# Patient Record
Sex: Female | Born: 1970 | Race: White | Hispanic: No | Marital: Married | Smoking: Never smoker
Health system: Southern US, Community
[De-identification: ages and names within clinical notes are randomized; demographics above are authoritative.]

## PROBLEM LIST (undated history)

## (undated) DIAGNOSIS — O039 Complete or unspecified spontaneous abortion without complication: Secondary | ICD-10-CM

## (undated) DIAGNOSIS — I471 Supraventricular tachycardia, unspecified: Secondary | ICD-10-CM

## (undated) DIAGNOSIS — I493 Ventricular premature depolarization: Secondary | ICD-10-CM

## (undated) DIAGNOSIS — Q250 Patent ductus arteriosus: Secondary | ICD-10-CM

## (undated) HISTORY — DX: Patent ductus arteriosus: Q25.0

## (undated) HISTORY — DX: Ventricular premature depolarization: I49.3

## (undated) HISTORY — DX: Supraventricular tachycardia: I47.1

## (undated) HISTORY — DX: Complete or unspecified spontaneous abortion without complication: O03.9

## (undated) HISTORY — DX: Supraventricular tachycardia, unspecified: I47.10

---
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
## 1999-04-12 ENCOUNTER — Other Ambulatory Visit: Admission: RE | Admit: 1999-04-12 | Discharge: 1999-04-12 | Payer: Self-pay | Admitting: Obstetrics and Gynecology

## 2000-02-19 ENCOUNTER — Other Ambulatory Visit: Admission: RE | Admit: 2000-02-19 | Discharge: 2000-02-19 | Payer: Self-pay | Admitting: Obstetrics and Gynecology

## 2000-04-04 ENCOUNTER — Encounter: Payer: Self-pay | Admitting: Obstetrics and Gynecology

## 2000-04-04 ENCOUNTER — Ambulatory Visit (HOSPITAL_COMMUNITY): Admission: RE | Admit: 2000-04-04 | Discharge: 2000-04-04 | Payer: Self-pay | Admitting: Obstetrics and Gynecology

## 2000-06-17 ENCOUNTER — Ambulatory Visit (HOSPITAL_COMMUNITY): Admission: RE | Admit: 2000-06-17 | Discharge: 2000-06-17 | Payer: Self-pay | Admitting: Obstetrics and Gynecology

## 2000-08-25 ENCOUNTER — Inpatient Hospital Stay (HOSPITAL_COMMUNITY): Admission: AD | Admit: 2000-08-25 | Discharge: 2000-08-27 | Payer: Self-pay | Admitting: Obstetrics and Gynecology

## 2001-03-19 ENCOUNTER — Other Ambulatory Visit: Admission: RE | Admit: 2001-03-19 | Discharge: 2001-03-19 | Payer: Self-pay | Admitting: Obstetrics and Gynecology

## 2002-04-13 ENCOUNTER — Other Ambulatory Visit: Admission: RE | Admit: 2002-04-13 | Discharge: 2002-04-13 | Payer: Self-pay | Admitting: Obstetrics and Gynecology

## 2003-12-06 ENCOUNTER — Other Ambulatory Visit: Admission: RE | Admit: 2003-12-06 | Discharge: 2003-12-06 | Payer: Self-pay | Admitting: Obstetrics and Gynecology

## 2004-11-29 ENCOUNTER — Inpatient Hospital Stay (HOSPITAL_COMMUNITY): Admission: AD | Admit: 2004-11-29 | Discharge: 2004-11-29 | Payer: Self-pay | Admitting: Obstetrics and Gynecology

## 2005-02-12 ENCOUNTER — Inpatient Hospital Stay (HOSPITAL_COMMUNITY): Admission: AD | Admit: 2005-02-12 | Discharge: 2005-02-13 | Payer: Self-pay | Admitting: Obstetrics and Gynecology

## 2005-02-14 ENCOUNTER — Encounter: Admission: RE | Admit: 2005-02-14 | Discharge: 2005-02-26 | Payer: Self-pay | Admitting: Obstetrics and Gynecology

## 2005-03-26 ENCOUNTER — Other Ambulatory Visit: Admission: RE | Admit: 2005-03-26 | Discharge: 2005-03-26 | Payer: Self-pay | Admitting: Obstetrics and Gynecology

## 2005-06-04 ENCOUNTER — Encounter: Admission: RE | Admit: 2005-06-04 | Discharge: 2005-06-04 | Payer: Self-pay | Admitting: Dermatology

## 2007-08-16 IMAGING — US EM OFFICE/OP CONSULT LEVEL 3 (40)
1 series · 13 of 16 positions shown · non-contrast
Comparison: none

[Series 1: unknown · 13 of 42 slices shown]
[im 1/42]
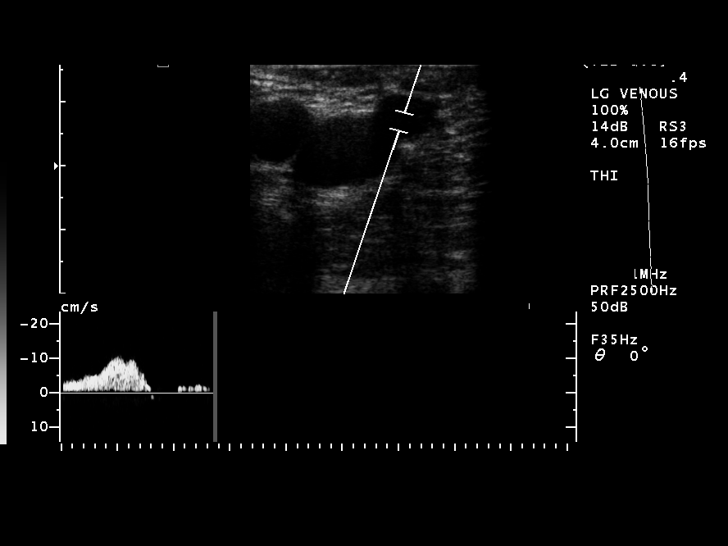
[im 3/42]
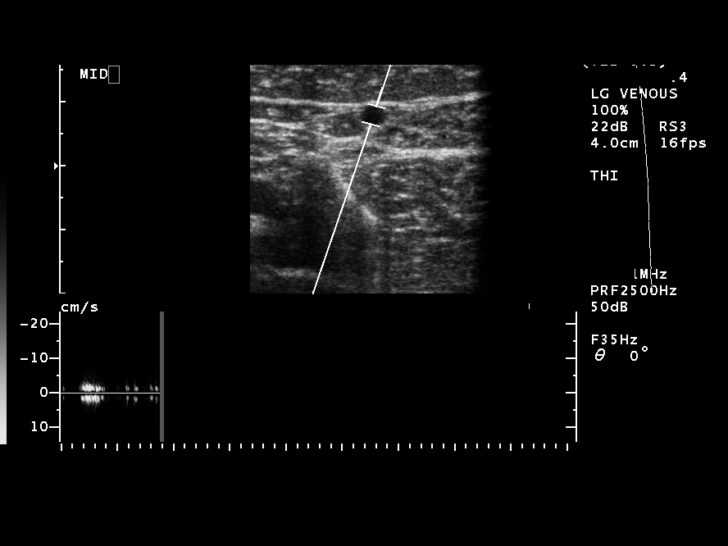
[im 9/42]
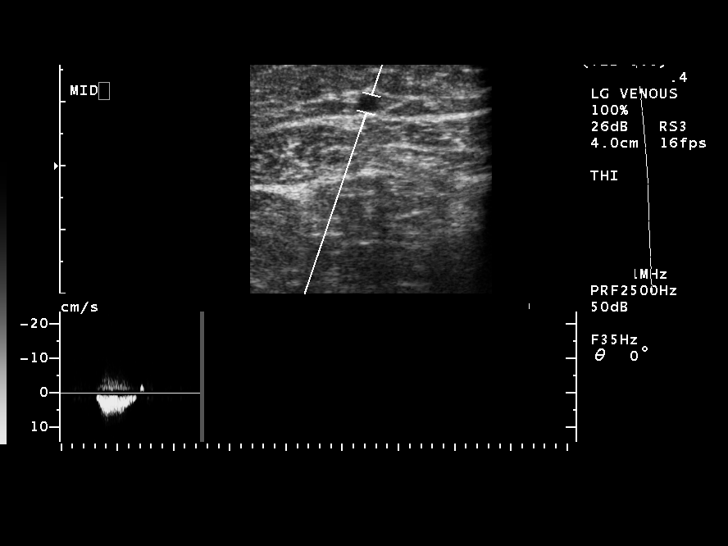
[im 11/42]
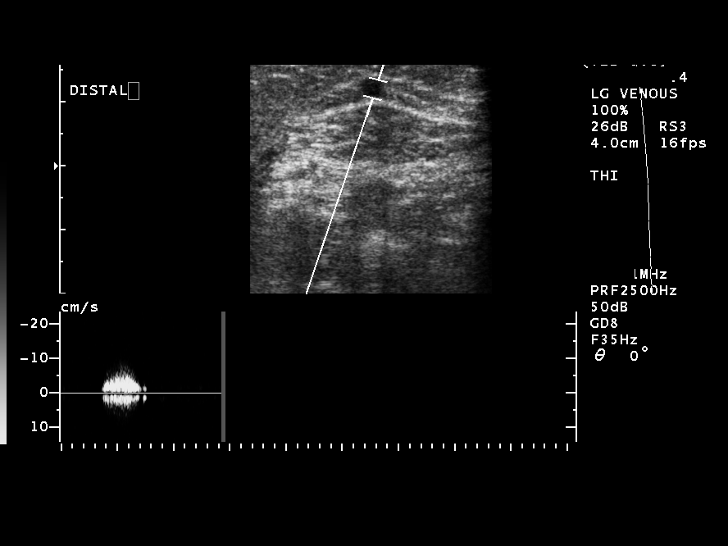
[im 14/42]
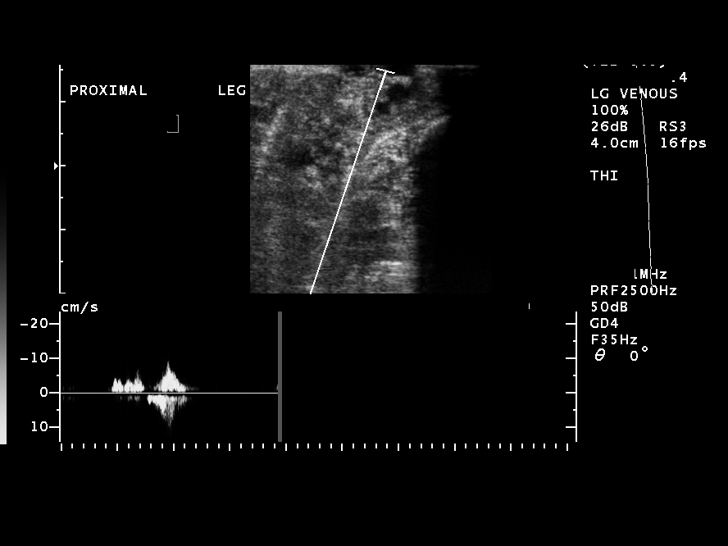
[im 17/42]
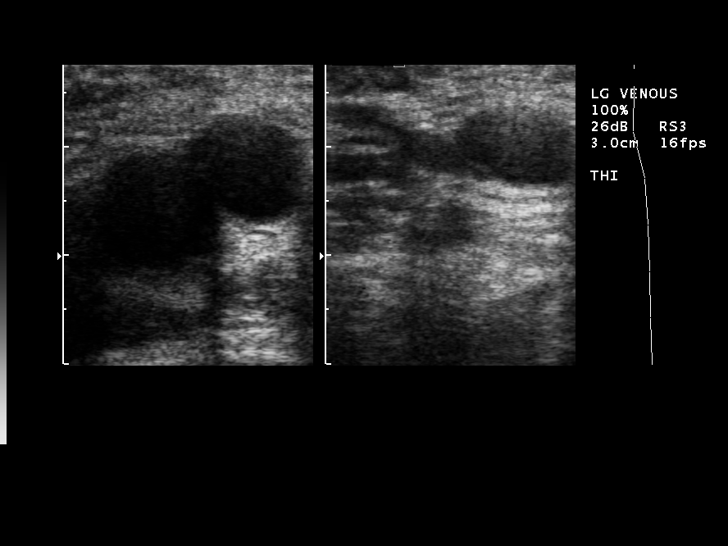
[im 22/42]
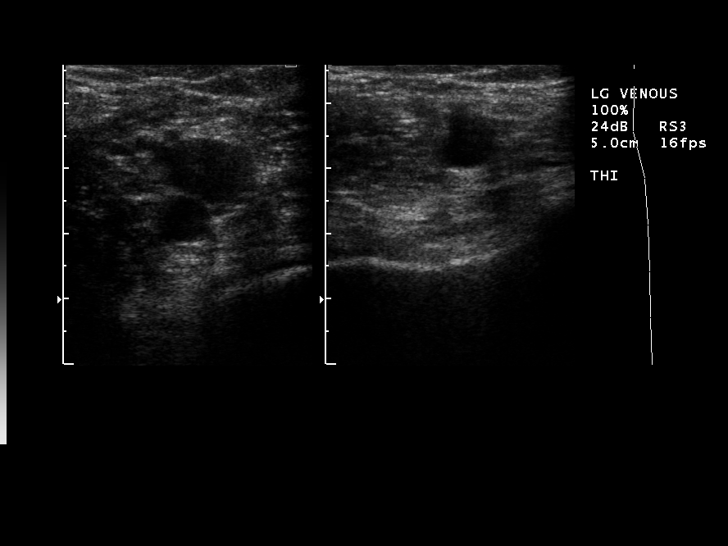
[im 25/42]
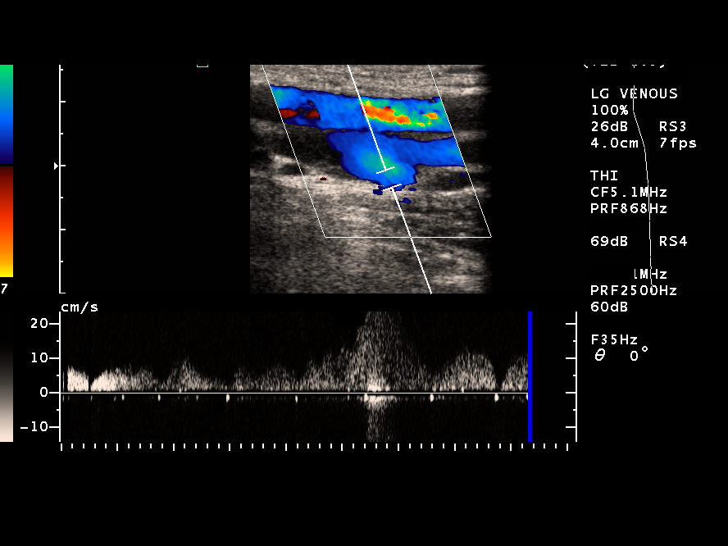
[im 28/42]
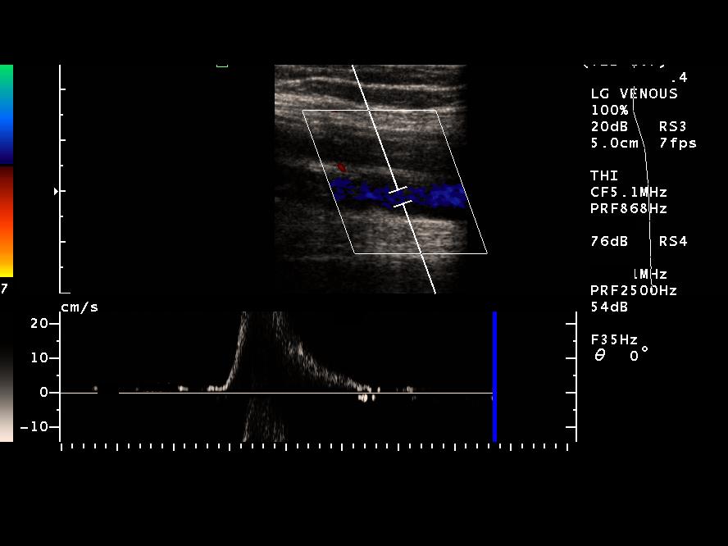
[im 31/42]
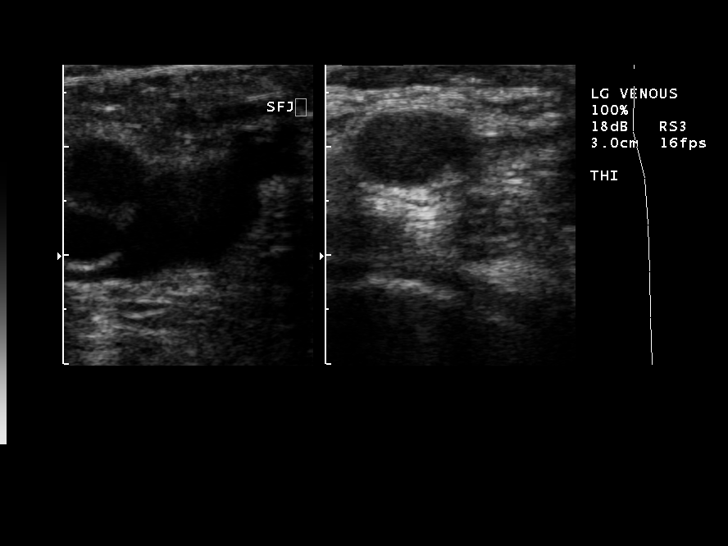
[im 33/42]
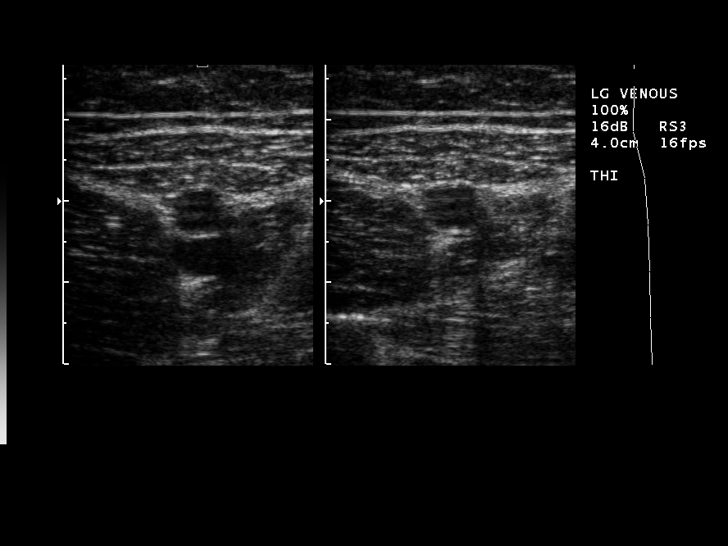
[im 39/42]
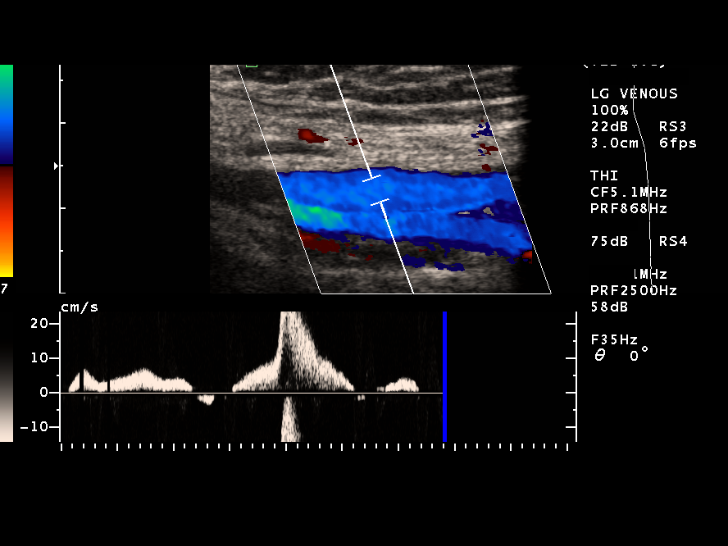
[im 42/42]
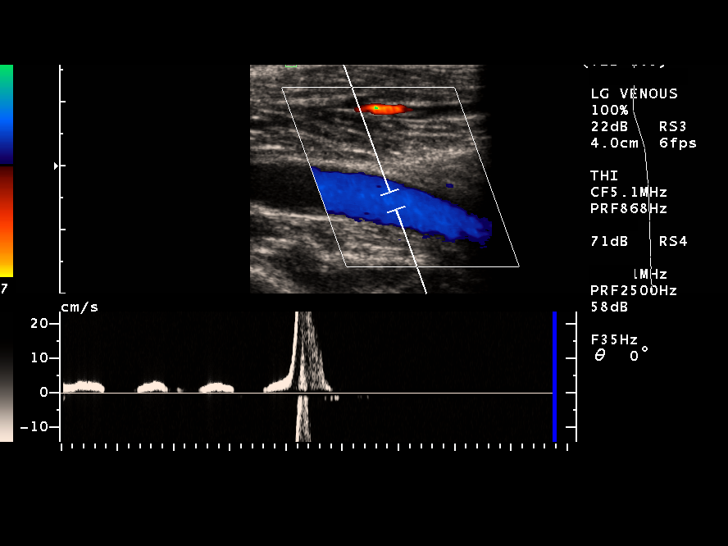

[13 of 16 positions shown; findings below may reference images not displayed]

OUTPATIENT CONSULTATION ? LEVEL III ? [id number]

 Juaquin Prajapati, M.D. 
 [HOSPITAL] Dermatology

 RE:  Dohoon Lauer (DOB ? 06/15/1970)

 Fellner Joas: 

 Thank you for referring Ms. Isacc for lower extremity venous evaluation for developing varicose veins.  

 Review of her venous history was performed.  She has had no prior treatments for varicose or spider veins.  There is no history of DVT or phlebitis.  She does have a family history of spider veins which affected her mother.  She currently does not take any hormone replacement therapy or birth control pills.  She has two small children at home and the small left calf varicose veins, spider veins have worsened since her last pregnancy.  She does wear over-the-counter knee high compression stockings occasionally.  She currently stays very busy with two young children at home however previously worked in hotel management and fitness which required standing 10 to 12 hours a day on concrete floors.  She currently does not take any pain medication for lower extremity pain.  Elevation does not alleviate the heaviness and pulling sensation in her legs.  

 On physical exam, the patient has scattered bilateral thigh, popliteal, and calf spider telangiectasias.  No evidence of lower extremity edema.  Symmetric +2 peripheral pulses.  In the medial left calf there is a small palpable subsurface varicose vein.  Ultrasound was performed today.  This was dictated as a separate report.  Briefly, the patient has normal superficial and deep venous anatomy.  No evidence of GSV or SSV venous insufficiency.  The left calf small developing varicosity is secondary to a posterior tibial incompetent perforator from the deep venous system.  

 Today, I spent approximately 45 minutes with Ms. Isacc and in detail discussed the physical exam and ultrasound findings.  Our discussion centered around treatment of the left calf small developing varicose vein.  The two treatment options would include ultrasound guided foam sclerotherapy and ambulatory phlebectomy.  The procedures, risks, benefits, and alternatives were reviewed.  For spider veins, these would respond to injection sclerotherapy.  This also was reviewed including the benefits, outcomes, risks, and complications.  

 At this time she would like to continue with conservative management and will consider injection sclerotherapy in the cooler months during the fall and winter.  She was given our contact information for any questions or if she would like to schedule injection sclerotherapy.  

 Again, thank you for referring Ms. Isacc for lower extremity venous evaluation.  

 Tobias-Allen, Chasrone, M.D.
 OVOCE:[place]

## 2008-05-19 ENCOUNTER — Encounter: Admission: RE | Admit: 2008-05-19 | Discharge: 2008-05-19 | Payer: Self-pay | Admitting: Obstetrics and Gynecology

## 2010-06-22 NOTE — Discharge Summary (Signed)
[facility]  Patient:    Linda Ho, Linda Ho                    MRN: [id number] Adm. Date:  [id number] Disc. Date: 08/27/00 Attending:  Malon Kindle                           Discharge Summary  HISTORY:                      A 40 year old white married female para 0, gravida 1, last period November 29, 1999, [facility] September 05, 2000 and September 06, 2000 by ultrasound admitted in early labor.  Blood group and type A- with a negative antibody.  Nonreactive serology.  Rubella non-immune.  Hepatitis B surface antigen negative.  HIV negative.  In October, 2001 GC and chlamydia negative.  Triple screen normal.  One hour glucola 102.  Group B strep negative.  Vaginal ultrasound on February 04, 2000:  Crown rump length 2.6 cm, 9 weeks 3 days, [facility] September 06, 2000.  Repeat ultrasound on April 04, 2000: Average gestational age [redacted] weeks 2 days, [facility] September 03, 2000.  She was treated with iron Jun 09, 2000 for anemia.  Prenatal course was otherwise uncomplicated.  At approximately 1:30 a.m. on the day of admission the patient began contracting.  She came to maternity admission unit and the cervix progressed from 1 cm to 3 cm under observation.  ALLERGIES:                    No known drug allergies.  PAST SURGICAL HISTORY:        Wisdom teeth extraction 1987.  PAST MEDICAL HISTORY:         Usual childhood diseases, heart murmur, uses antibiotics at the dentist.  SOCIAL HISTORY:               Alcohol, tobacco, and drugs:  None.  FAMILY HISTORY:               Father with an MI.  PHYSICAL EXAMINATION  VITAL SIGNS:                  Normal.  ABDOMEN:                      Soft.  Fundal height 37 cm.  On August 18, 2000 fetal heart tones were normal.  Heart rate was 52 in the mother.  There were no murmurs heard.  PELVIC:                       Cervix was 3 cm, 90%.                                The patient progressed to 4 cm at 9:30 a.m. and requested an epidural.   Dr. Senaida Ores was called to see the patient at 10:10 a.m. for decelerations after an epidural to the 80s-90s for 7-10 minutes.  On her arrival the patient had received ephedrine x 2 and fetal heart rate was recovered to 110-120 with positive variability.  The patient was examined with bloody fluid noted compatible with a partial abruption.  Fetal heart rate with variable decelerations, but overall with good variability and quick recovery. Patient was 100% effaced, completely dilated and the vertex  was at a +2 station and with pushing and good effort at 11:02 a.m. the patient had a spontaneous vaginal delivery of a vigorous female infant over a second degree laceration by Dr. Senaida Ores.  Apgars were 9 and 9.  Weight 7 pounds 2 ounces. A clot delivered with the infant.  Placenta separated spontaneously.  Three vessel cord.  Blood loss about 450 cc.  Sphincter was reinforced with 2-0 Vicryl and the remainder of second degree laceration repaired with 3-0 Vicryl. Cervix and rectum were intact.  Postpartum the patient did well.  Was discharged on the second postpartum day.  On admission hemoglobin 13, hematocrit 38.8, white count 14,700, platelet count 133,000.  Followup hemoglobin 11.1, hematocrit 31.8, platelet count 134,000, white count 11,400. RPR was nonreactive.  The patient is being evaluated for RhoGAM.  She is also being offered the rubella vaccine.  FINAL DIAGNOSES:              Intrauterine pregnancy at 38+ weeks, delivered, mitral valve prolapse, partial abruption.  OPERATION:                    Spontaneous vaginal delivery, repair of midline laceration.  FINAL CONDITION:              Improved.  DISCHARGE INSTRUCTIONS:       Regular discharge instruction booklet.  The patient is advised to return in six weeks.  She declines analgesics at discharge.  She is, like I said, evaluated for RhoGAM and rubella prior to discharge. DD:  08/27/00 TD:  08/27/00 Job: [id number]

## 2010-06-22 NOTE — Discharge Summary (Signed)
Linda Ho, Linda Ho NO.:  [id number]   MEDICAL RECORD NO.:  [id number]          PATIENT TYPE:  INP   LOCATION:  9117                          FACILITY:  [facility]   PHYSICIAN:  Huel Cote, M.D. DATE OF BIRTH:  15-May-1970   DATE OF ADMISSION:  02/12/2005  DATE OF DISCHARGE:                                 DISCHARGE SUMMARY   DISCHARGE DIAGNOSES:  1.  Term pregnancy at 38+ weeks delivered.  2.  Status post normal spontaneous vaginal delivery.   DISCHARGE MEDICATIONS:  Motrin 600 mg p.o. every 6 hours.   DISCHARGE FOLLOWUP:  The patient is to follow up in 6 weeks as scheduled for  a routine postpartum exam.   HOSPITAL COURSE:  The patient is a 40 year old G3 P 1-0-1-1 who was admitted  at 38+ weeks gestation in active labor. On admission cervix was 4-5 cm  dilated and she had had spontaneous rupture of membranes prior to admission.  Prenatal labs were as follows:  A negative, antibody negative, RPR  nonreactive, rubella immune, hepatitis B surface antigen negative, GC  negative, chlamydia negative, Glucola 114, group B strep negative. Prenatal  care been complicated by advanced maternal age with a 1/170 trisomy 21 risk  by first trimester screening. She had a normal second trimester ultrasound  and some vaginal bleeding in the late first trimester/early second  trimester; however, this resolved. Past OB history:  Had a normal  spontaneous vaginal delivery at 38 weeks of a 7-pound 2-ounce infant with a  partial abruption. In 2005 she had a miscarriage. 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 She does take prophylactic antibiotics when she goes to the  dentist; however, has not had a recent echocardiogram. Past surgical  history:  Wisdom teeth only. On admission she was afebrile with stable vital  signs. Fetal heart rate was reactive. Cervix was 4-5 and she had grossly  ruptured membranes. She  rapidly progressed to complete dilation and pushed  well with a normal spontaneous vaginal delivery of a 7-pound 1-ounce female  infant. There was a tight nuchal cord x2 which had to be clamped and cut.  The patient had a laceration from her clitoris to the left of the urethra  which was bleeding substantially and was repaired in two layers with 3-0  Vicryl. There was first-degree left vaginal perineal laceration which was  repaired with 2-0 Vicryl. Estimated blood loss was 700 mL. On postpartum day  #1 the patient was doing well. She had no complaints. She requested early  discharge if her baby was able to go. Her pain was well controlled.  Hemoglobin was down to 8.0 from 11 on admission consistent with her  estimated blood loss at delivery. Fundus was firm. She was afebrile and felt  stable for discharge home. The patient will see if pediatrics agrees with  early discharge baby and be discharged if that is the case.      Huel Cote, M.D.  Electronically Signed     KR/MEDQ  D:  02/13/2005  T:  02/13/2005  Job:  [id number]

## 2010-07-20 ENCOUNTER — Other Ambulatory Visit: Payer: Self-pay | Admitting: *Deleted

## 2010-07-20 MED ORDER — METOPROLOL SUCCINATE ER 25 MG PO TB24: 25.0000 mg | ORAL_TABLET | Freq: Every day | ORAL | Status: DC

## 2010-11-13 ENCOUNTER — Other Ambulatory Visit: Payer: Self-pay | Admitting: Cardiology

## 2011-02-07 ENCOUNTER — Ambulatory Visit (INDEPENDENT_AMBULATORY_CARE_PROVIDER_SITE_OTHER): Payer: BC Managed Care – PPO | Admitting: Cardiology

## 2011-02-07 ENCOUNTER — Encounter: Payer: Self-pay | Admitting: Cardiology

## 2011-02-07 VITALS — BP 114/74 | HR 61 | Ht 67.0 in | Wt 126.0 lb

## 2011-02-07 DIAGNOSIS — I471 Supraventricular tachycardia: Secondary | ICD-10-CM

## 2011-02-07 DIAGNOSIS — I10 Essential (primary) hypertension: Secondary | ICD-10-CM

## 2011-02-07 DIAGNOSIS — I4949 Other premature depolarization: Secondary | ICD-10-CM

## 2011-02-07 DIAGNOSIS — I493 Ventricular premature depolarization: Secondary | ICD-10-CM

## 2011-02-07 DIAGNOSIS — I498 Other specified cardiac arrhythmias: Secondary | ICD-10-CM

## 2011-02-07 MED ORDER — METOPROLOL SUCCINATE ER 25 MG PO TB24: 25.0000 mg | ORAL_TABLET | Freq: Every day | ORAL | Status: DC

## 2011-02-07 NOTE — Assessment & Plan Note (Signed)
She has had no tachycardia or palpitations in recent memory. I told her it would be safe to stop the Toprol at this time and use it only on a when necessary basis. I will see her back as needed.

## 2011-02-07 NOTE — Progress Notes (Signed)
   Linda Ho Date of Birth: [birth year] Medical Record #[id number]  History of Present Illness: Linda Ho is seen today for followup of palpitations. She was last seen 2 years ago. She has remote history of supraventricular tachycardia and occasional PVCs. She has really had no significant palpitations in her memory. She is only taking 3.125-6.25 mg of Toprol daily. She is anxious about stopping it. She denies any shortness of breath or chest pain. She remains very active physically. She avoids caffeine.  No current outpatient prescriptions on file prior to visit.    No Known Allergies  Past Medical History  Diagnosis Date  . Miscarriage   . Patent ductus arteriosus     had a childhood cardiac defect which was a probable patent ductus arteriosus  . PVC's (premature ventricular contractions)   . SVT (supraventricular tachycardia)     Past Surgical History  Procedure Date  . Wisdom tooth extraction 1987    History  Smoking status  . Never Smoker   Smokeless tobacco  . Not on file    History  Alcohol Use No    Family History  Problem Relation Age of Onset  . Heart attack Father 65    MI    Review of Systems: As noted in history of present illness.  All other systems were reviewed and are negative.  Physical Exam: BP 114/74  Pulse 61  Ht 5\' 7"  (1.702 m)  Wt 126 lb (57.153 kg)  BMI 19.73 kg/m2 The patient is alert and oriented x 3.  The mood and affect are normal.  The skin is warm and dry.  Color is normal.  The HEENT exam reveals that the sclera are nonicteric.  The mucous membranes are moist.  The carotids are 2+ without bruits.  There is no thyromegaly.  There is no JVD.  The lungs are clear.  The chest wall is non tender.  The heart exam reveals a regular rate with a normal S1 and S2.  There are no murmurs, gallops, or rubs.  The PMI is not displaced.   Abdominal exam reveals good bowel sounds.  There is no guarding or rebound.  There is no hepatosplenomegaly  or tenderness.  There are no masses.  Exam of the legs reveal no clubbing, cyanosis, or edema.  The legs are without rashes.  The distal pulses are intact.  Cranial nerves II - XII are intact.  Motor and sensory functions are intact.  The gait is normal.  LABORATORY DATA:   Assessment / Plan:

## 2011-02-07 NOTE — Patient Instructions (Signed)
You may safely stop the metoprolol. You may use it prn.

## 2011-04-15 ENCOUNTER — Telehealth: Payer: Self-pay | Admitting: Cardiology

## 2011-04-15 NOTE — Telephone Encounter (Signed)
Pt concerned about hypertension being listed as a diagnosis.  She states Dr Swaziland asked her if she had a history of it.  She states she has never been told she has hypertention.  She is concerned about having hypertension listed on her chart when she doesn't have it.  She wanted Dr Elvis Coil last note checked to see if hypertension was mentioned.  I told her that his last note and the one prior to that does not list hypertension as a problem or diagnosis and that it is not listed on the snapshot as one of her current or past medical problems.

## 2011-04-15 NOTE — Telephone Encounter (Signed)
Please return call to patient on cell # [phone]   Patient would like return call, did not give info.

## 2011-10-14 ENCOUNTER — Encounter: Payer: Self-pay | Admitting: Cardiology

## 2011-10-23 ENCOUNTER — Ambulatory Visit: Payer: BC Managed Care – PPO | Admitting: Cardiology

## 2012-01-09 ENCOUNTER — Other Ambulatory Visit: Payer: Self-pay | Admitting: Obstetrics and Gynecology

## 2012-01-09 DIAGNOSIS — Z1231 Encounter for screening mammogram for malignant neoplasm of breast: Secondary | ICD-10-CM

## 2012-02-20 ENCOUNTER — Ambulatory Visit
Admission: RE | Admit: 2012-02-20 | Discharge: 2012-02-20 | Disposition: A | Discharge status: Home / Self Care | Payer: BC Managed Care – PPO | Source: Ambulatory Visit | Attending: Obstetrics and Gynecology | Admitting: Obstetrics and Gynecology

## 2012-02-20 DIAGNOSIS — Z1231 Encounter for screening mammogram for malignant neoplasm of breast: Secondary | ICD-10-CM

## 2012-02-25 ENCOUNTER — Other Ambulatory Visit: Payer: Self-pay | Admitting: Obstetrics and Gynecology

## 2012-02-25 DIAGNOSIS — R928 Other abnormal and inconclusive findings on diagnostic imaging of breast: Secondary | ICD-10-CM

## 2012-02-27 ENCOUNTER — Ambulatory Visit: Payer: BC Managed Care – PPO | Admitting: Cardiology

## 2012-03-02 ENCOUNTER — Other Ambulatory Visit: Payer: Self-pay | Admitting: Obstetrics and Gynecology

## 2012-03-02 DIAGNOSIS — R928 Other abnormal and inconclusive findings on diagnostic imaging of breast: Secondary | ICD-10-CM

## 2012-03-23 ENCOUNTER — Ambulatory Visit
Admission: RE | Admit: 2012-03-23 | Discharge: 2012-03-23 | Disposition: A | Discharge status: Home / Self Care | Payer: BC Managed Care – PPO | Source: Ambulatory Visit | Attending: Obstetrics and Gynecology | Admitting: Obstetrics and Gynecology

## 2012-03-23 DIAGNOSIS — R928 Other abnormal and inconclusive findings on diagnostic imaging of breast: Secondary | ICD-10-CM

## 2012-07-07 ENCOUNTER — Other Ambulatory Visit: Payer: Self-pay | Admitting: Cardiology

## 2012-07-07 NOTE — Telephone Encounter (Signed)
SVT (supraventricular tachycardia) - Peter M Swaziland, MD at 02/07/2011  9:35 AM    Status: Written Related Problem: SVT (supraventricular tachycardia)           She has had no tachycardia or palpitations in recent memory. I told her it would be safe to stop the Toprol at this time and use it only on a when necessary basis. I will see her back as needed.

## 2012-07-09 ENCOUNTER — Other Ambulatory Visit: Payer: Self-pay | Admitting: Cardiology

## 2012-07-09 ENCOUNTER — Telehealth: Payer: Self-pay | Admitting: Cardiology

## 2012-07-09 MED ORDER — METOPROLOL SUCCINATE ER 25 MG PO TB24: ORAL_TABLET | ORAL | Status: DC

## 2012-07-09 NOTE — Telephone Encounter (Signed)
metoprolol succinate (TOPROL XL) 25 MG 24 hr tablet (Discontinued) 30 tablet 11 02/07/2011 07/09/2012    Take 1 tablet (25 mg total) by mouth daily. - Oral    Notes to Pharmacy: No refills available.NEEDS TO SCHEDULE AN OFFICE VISIT    Reason for Discontinue: Reorder

## 2012-07-09 NOTE — Telephone Encounter (Signed)
Pt called to have a prescription send for Toprol XL 25 mg PRN as needed for palpitations. A prescription for 15 pills with no refills send to [facility]. Pt's last office visit was January 2013. Pt is to see Dr. Swaziland as needed. Pt does not have an appointment to be seen at this time.

## 2012-07-09 NOTE — Telephone Encounter (Signed)
New problem   Pt want to speak to nurse concerning her prescription for Toprol XL 25 that has expired and want a new prescription for it. Please call pt

## 2012-07-16 MED ORDER — METOPROLOL SUCCINATE ER 25 MG PO TB24: 25.0000 mg | ORAL_TABLET | Freq: Every day | ORAL | Status: DC

## 2012-07-16 NOTE — Telephone Encounter (Signed)
Patient called no answer.Left message on personal voice mail spoke to Dr.Jordan he advised may refill toprol and follow up with him as needed.

## 2013-11-11 ENCOUNTER — Other Ambulatory Visit: Payer: Self-pay | Admitting: Cardiology

## 2014-08-16 ENCOUNTER — Ambulatory Visit: Payer: Self-pay | Admitting: Cardiology

## 2014-09-29 ENCOUNTER — Ambulatory Visit: Payer: Self-pay | Admitting: Cardiology

## 2015-03-14 ENCOUNTER — Other Ambulatory Visit: Payer: Self-pay | Admitting: Cardiology

## 2015-03-15 ENCOUNTER — Other Ambulatory Visit: Payer: Self-pay

## 2015-03-22 ENCOUNTER — Other Ambulatory Visit: Payer: Self-pay | Admitting: *Deleted

## 2015-03-22 MED ORDER — METOPROLOL SUCCINATE ER 25 MG PO TB24: 25.0000 mg | ORAL_TABLET | Freq: Every day | ORAL | Status: AC

## 2017-08-16 ENCOUNTER — Emergency Department (HOSPITAL_COMMUNITY)
Admission: EM | Admit: 2017-08-16 | Discharge: 2017-08-16 | Disposition: A | Discharge status: Home / Self Care | Payer: BLUE CROSS/BLUE SHIELD | Attending: Emergency Medicine | Admitting: Emergency Medicine

## 2017-08-16 ENCOUNTER — Encounter (HOSPITAL_COMMUNITY): Payer: Self-pay | Admitting: Emergency Medicine

## 2017-08-16 DIAGNOSIS — R2231 Localized swelling, mass and lump, right upper limb: Secondary | ICD-10-CM | POA: Diagnosis present

## 2017-08-16 DIAGNOSIS — R591 Generalized enlarged lymph nodes: Secondary | ICD-10-CM

## 2017-08-16 DIAGNOSIS — R590 Localized enlarged lymph nodes: Secondary | ICD-10-CM | POA: Insufficient documentation

## 2017-08-16 DIAGNOSIS — R599 Enlarged lymph nodes, unspecified: Secondary | ICD-10-CM

## 2017-08-16 DIAGNOSIS — R0989 Other specified symptoms and signs involving the circulatory and respiratory systems: Secondary | ICD-10-CM

## 2017-08-16 NOTE — ED Triage Notes (Signed)
Patient here from home reporting right under arm pain. Reports "it looks like a lump in the blood vessel".

## 2017-08-16 NOTE — ED Provider Notes (Signed)
[facility]-EMERGENCY DEPT Provider Note   CSN: [id number] Arrival date & time: 08/16/17  0915     History   Chief Complaint Chief Complaint  Patient presents with  . Arm Pain    HPI Linda Ho is a 47 y.o. female who presents the emergency department for complaint of swelling under her right axilla.  Patient states that she noticed a small swollen area under the left axilla.  She states that it is mildly tender to touch.  She wanted to have it checked out.  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  She has no other medical issues.  She denies fevers, chills, tick bites, other known swelling  HPI  Past Medical History:  Diagnosis Date  . Miscarriage   . Patent ductus arteriosus    had a childhood cardiac defect which was a probable patent ductus arteriosus  . PVC's (premature ventricular contractions)   . SVT (supraventricular tachycardia) [facility](HCC)     Patient Active Problem List   Diagnosis Date Noted  . PVC's (premature ventricular contractions) 02/07/2011  . SVT (supraventricular tachycardia) (HCC) 02/07/2011    Past Surgical History:  Procedure Laterality Date  . WISDOM TOOTH EXTRACTION  1987     OB History   None      Home Medications    Prior to Admission medications   Medication Sig Start Date End Date Taking? Authorizing Provider  metoprolol succinate (TOPROL XL) 25 MG 24 hr tablet Take 1 tablet (25 mg total) by mouth daily. NEED OV. 03/22/15   SwazilandJordan, Peter M, MD    Family History Family History  Problem Relation Age of Onset  . Heart attack Father 7034       MI    Social History Social History   Tobacco Use  . Smoking status: Never Smoker  Substance Use Topics  . Alcohol use: No  . Drug use: Not on file     Allergies   Patient has no known  allergies.   Review of Systems Review of Systems Ten systems reviewed and are negative for acute change, except as noted in the HPI.    Physical Exam Updated Vital Signs BP 135/76 (BP Location: Left Arm)   Pulse 67   Temp 98 F (36.7 C) (Oral)   Resp 18   SpO2 99%   Physical Exam  Constitutional: She is oriented to person, place, and time. She appears well-developed and well-nourished. No distress.  HENT:  Head: Normocephalic and atraumatic.  Eyes: Conjunctivae are normal. No scleral icterus.  Neck: Normal range of motion.  Cardiovascular: Normal rate, regular rhythm and normal heart sounds. Exam reveals no gallop and no friction rub.  No murmur heard. Pulmonary/Chest: Effort normal and breath sounds normal. No respiratory distress.  Abdominal: Soft. Bowel sounds are normal. She exhibits no distension and no mass. There is no tenderness. There is no guarding.  Lymphadenopathy:  Patient with solitary, lymph node in the right axilla which is about the size of a lima bean.  There are no other swollen nodes in the chain.  She has no supraclavicular epitrochlear or left axillary nodes.  No inguinal nodes that are swollen.  Patient is markedly thin and small shotty lymph nodes are visible through her tissue.  There does not appear to be any  any local tissue infections causing this singular lymph issue.  Neurological: She is alert and oriented to person, place, and time.  Skin: Skin is warm and dry. She is not diaphoretic.  Psychiatric: Her behavior is normal.  Anxious  Nursing note and vitals reviewed.    ED Treatments / Results  Labs (all labs ordered are listed, but only abnormal results are displayed) Labs Reviewed - No data to display  EKG None  Radiology No results found.  Procedures Procedures (including critical care time)  Medications Ordered in ED Medications - No data to display   Initial Impression / Assessment and Plan / ED Course  I have reviewed the  triage vital signs and the nursing notes.  Pertinent labs & imaging results that were available during my care of the patient were reviewed by me and considered in my medical decision making (see chart for details).     Patient with solitary lymph node tenderness and swelling.  She does not appear to have any other swollen or tender nodes.  She has no known tick bites.  She denies any breast discharge, tissue changes.  Patient does not appear to have infection or cyst in the skin.  Vies the patient to follow-up with her PCP and have a full breast examination.  Do not think that there is any emergent cause of her symptom at this time and will discharge for outpatient follow-up.  Final Clinical Impressions(s) / ED Diagnoses   Final diagnoses:  None    ED Discharge Orders    None       Arthor Captain, PA-C 08/16/17 1428    Cathren Laine, MD 08/17/17 [phone]

## 2017-08-16 NOTE — Discharge Instructions (Addendum)
Follow up with a primary care doctor in the coming week. You may also need a Breast exam and full breast work up as an outpatient to rule out any type of Breast Cancer, although it is extremely unlikely given a single enlarged node. This cannot be done in an emergency department or in the Hospital as we do not have the appropriate equipment.   Get help right away if: You notice fluid leaking from the area of the enlarged lymph node. You have severe pain in any area of your body. You have chest pain. You have shortness of breath. You have bleeding or discharge from your nipple You have swelling and skin discoloration on your breast.

## 2021-01-28 ENCOUNTER — Other Ambulatory Visit: Payer: Self-pay

## 2021-01-28 ENCOUNTER — Emergency Department (HOSPITAL_BASED_OUTPATIENT_CLINIC_OR_DEPARTMENT_OTHER)
Admission: EM | Admit: 2021-01-28 | Discharge: 2021-01-28 | Disposition: A | Discharge status: Home / Self Care | Payer: 59 | Attending: Emergency Medicine | Admitting: Emergency Medicine

## 2021-01-28 ENCOUNTER — Encounter (HOSPITAL_BASED_OUTPATIENT_CLINIC_OR_DEPARTMENT_OTHER): Payer: Self-pay | Admitting: Emergency Medicine

## 2021-01-28 DIAGNOSIS — S8011XA Contusion of right lower leg, initial encounter: Secondary | ICD-10-CM | POA: Diagnosis not present

## 2021-01-28 DIAGNOSIS — X58XXXA Exposure to other specified factors, initial encounter: Secondary | ICD-10-CM | POA: Insufficient documentation

## 2021-01-28 DIAGNOSIS — S8991XA Unspecified injury of right lower leg, initial encounter: Secondary | ICD-10-CM | POA: Diagnosis present

## 2021-01-28 DIAGNOSIS — T148XXA Other injury of unspecified body region, initial encounter: Secondary | ICD-10-CM

## 2021-01-28 NOTE — ED Provider Notes (Signed)
[facility] EMERGENCY DEPT Provider Note   CSN: [id number] Arrival date & time: 01/28/21  7829     History Chief Complaint  Patient presents with   Skin Discoloration    Linda Ho is a 50 y.o. female.  HPI  50 year old female presenting to the emergency department with concern for skin discoloration.  The patient states that her right calf along the lateral aspect developed a small amount of bruising with a lump that seems to have subsequently resolved.  She notes now some bruising to her lateral calf.  She denies any pain in her posterior calf or posterior knee.  She denies any cramping.  She takes herbal supplements which can cause bleeding.  She has since stopped taking the supplement.  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     Family History  Problem Relation Age of Onset   Heart attack Father 18       MI    Social History   Tobacco Use   Smoking status: Never  Vaping Use   Vaping Use: Never used  Substance Use Topics   Alcohol use: No   Drug use: Not Currently    Home Medications Prior to Admission medications   Medication Sig Start Date End Date Taking? Authorizing Provider  metoprolol succinate (TOPROL XL) 25 MG 24 hr tablet Take 1 tablet (25 mg total) by mouth daily. NEED OV. 03/22/15   [place], Peter M, MD    Allergies    Patient has no known allergies.  Review of Systems   Review of  Systems  Skin:  Positive for color change.  All other systems reviewed and are negative.  Physical Exam Updated Vital Signs BP 137/82 (BP Location: Right Arm)    Pulse (!) 53    Temp 98 F (36.7 C) (Oral)    Resp 18    Ht 5\' 7"  (1.702 m)    Wt 52.2 kg    LMP 10/30/2020    SpO2 100%    BMI 18.01 kg/m   Physical Exam Vitals and nursing note reviewed.  Constitutional:      General: She is not in acute distress.    Appearance: She is well-developed.  HENT:     Head: Normocephalic and atraumatic.  Eyes:     Conjunctiva/sclera: Conjunctivae normal.  Cardiovascular:     Rate and Rhythm: Normal rate and regular rhythm.     Heart sounds: No murmur heard. Pulmonary:     Effort: Pulmonary effort is normal. No respiratory distress.     Breath sounds: Normal breath sounds.  Abdominal:     Palpations: Abdomen is soft.     Tenderness: There is no abdominal tenderness.  Musculoskeletal:        General: No swelling.     Cervical back: Neck supple.     Comments: Distal right lower extremity neurovascularly intact  Skin:    General:  Skin is warm and dry.     Capillary Refill: Capillary refill takes less than 2 seconds.     Findings: Bruising present.     Comments: Small hematoma noted to the lateral aspect of the right lower extremity.  No cordlike mass palpated, no tenderness to palpation of the posterior calf  Neurological:     Mental Status: She is alert.  Psychiatric:        Mood and Affect: Mood normal.    ED Results / Procedures / Treatments   Labs (all labs ordered are listed, but only abnormal results are displayed) Labs Reviewed - No data to display  EKG None  Radiology No results found.  Procedures Procedures   Medications Ordered in ED Medications - No data to display  ED Course  I have reviewed the triage vital signs and the nursing notes.  Pertinent labs & imaging results that were available during my care of the patient were reviewed by me and considered in  my medical decision making (see chart for details).    MDM Rules/Calculators/A&P                           50 year old female presenting to the emergency department with concern for skin discoloration.  The patient states that her right calf along the lateral aspect developed a small amount of bruising with a lump that seems to have subsequently resolved.  She notes now some bruising to her lateral calf.  She denies any pain in her posterior calf or posterior knee.  She denies any cramping.  She takes herbal supplements which can cause bleeding.  She has since stopped taking the supplement.  On exam, small hematoma/bruise was noted to the right lateral lower extremity.  No concern for DVT at this time.  Patient counseled to cease taking her supplement as it can cause bleeding and advised outpatient follow-up.  Final Clinical Impression(s) / ED Diagnoses Final diagnoses:  Hematoma    Rx / DC Orders ED Discharge Orders     None        Ernie Avena, MD 01/29/21 2304

## 2021-01-28 NOTE — ED Triage Notes (Signed)
Pt via pov from home with discoloration on her right calf. Pt states it felt like a lump last night and then it became discolored this morning and that the lump seems to have resolved. Pt details some supplements she has been taking, including herbal vasodilators. Pt alert& oriented, nad noted.

## 2021-01-28 NOTE — Discharge Instructions (Signed)
You were evaluated in the Emergency Department and after careful evaluation, we did not find any emergent condition requiring admission or further testing in the hospital.  Your exam/testing today was overall reassuring.  Recommend you stop taking mother work as you have done as it can increase your risk for bleeding and can impair platelet function.  Please return to the Emergency Department if you experience any worsening of your condition.  Thank you for allowing [place] to be a part of your care.

## 2021-01-28 NOTE — ED Notes (Signed)
Pt discharged home after verbalizing understanding of discharge instructions; nad noted. 

## 2021-02-16 ENCOUNTER — Other Ambulatory Visit: Payer: Self-pay | Admitting: Family Medicine

## 2021-02-16 DIAGNOSIS — Z1231 Encounter for screening mammogram for malignant neoplasm of breast: Secondary | ICD-10-CM

## 2022-04-24 DIAGNOSIS — Z862 Personal history of diseases of the blood and blood-forming organs and certain disorders involving the immune mechanism: Secondary | ICD-10-CM | POA: Diagnosis not present

## 2022-04-24 DIAGNOSIS — Z13220 Encounter for screening for lipoid disorders: Secondary | ICD-10-CM | POA: Diagnosis not present

## 2022-04-25 ENCOUNTER — Other Ambulatory Visit: Payer: Self-pay | Admitting: Family Medicine

## 2022-04-25 DIAGNOSIS — Z1231 Encounter for screening mammogram for malignant neoplasm of breast: Secondary | ICD-10-CM

## 2022-07-09 ENCOUNTER — Ambulatory Visit: Payer: Self-pay

## 2022-09-19 ENCOUNTER — Ambulatory Visit: Payer: Self-pay
